# Patient Record
Sex: Female | Born: 2000 | Race: White
Health system: Southern US, Community
[De-identification: ages and names within clinical notes are randomized; demographics above are authoritative.]

## PROBLEM LIST (undated history)

## (undated) DIAGNOSIS — R51 Headache: Secondary | ICD-10-CM

## (undated) DIAGNOSIS — R519 Headache, unspecified: Secondary | ICD-10-CM

## (undated) HISTORY — DX: Headache, unspecified: R51.9

## (undated) HISTORY — DX: Headache: R51

---
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
## 2020-04-26 ENCOUNTER — Telehealth: Payer: 59 | Admitting: Family Medicine

## 2020-04-26 ENCOUNTER — Other Ambulatory Visit: Payer: Self-pay

## 2020-04-26 DIAGNOSIS — Z91199 Patient's noncompliance with other medical treatment and regimen due to unspecified reason: Secondary | ICD-10-CM

## 2020-04-26 DIAGNOSIS — Z5329 Procedure and treatment not carried out because of patient's decision for other reasons: Secondary | ICD-10-CM

## 2020-04-26 NOTE — Progress Notes (Addendum)
      Zebulin Siegel T. Denicia Pagliarulo, MD Primary Care and Sports Medicine [facility] at [facility] [address] [place], [address] Phone: [phone]  FAX: [phone]  Crystal Fuller - 20 y.o. female  MRN [id number]  Date of Birth: 02/16/2001  Visit Date: 04/26/2020  PCP: Doreene Nest, NP  Referred by: Doreene Nest, NP  Virtual Visit via Video Note:  I connected with  Crystal Fuller on 04/26/2020 12:00 PM EST by a video enabled telemedicine application and verified that I am speaking with the correct person using two identifiers.   Location patient: home computer, tablet, or smartphone Location provider: work or home office Consent: Verbal consent directly obtained from [employer]. Persons participating in the virtual visit: patient, provider  I discussed the limitations of evaluation and management by telemedicine and the availability of in person appointments. The patient expressed understanding and agreed to proceed.  Chief Complaint  Patient presents with  . Covid +    Symptoms started 1-12. Positive 1-13. Headache, cough, nasal congestion, chills, sore throat, difficulty breathing yesterday. Taking severe cold OTC. No fever. Not vaccinated.    History of Present Illness:  Would not answer phone Will not answer phone. I have been trying for 1 1/2 hours. 12:49 PM 04/26/20   Attempting call again.  Received contact from mother.    Review of Systems as above: See pertinent positives and pertinent negatives per HPI No acute distress verbally   Observations/Objective/Exam:  An attempt was made to discern vital signs over the phone and per patient if applicable and possible.   General:    Alert, Oriented, appears well and in no acute distress  Pulmonary:     On inspection no signs of respiratory distress.  Psych / Neurological:     Pleasant and cooperative.  Assessment and Plan:  No show.   I discussed the assessment and  treatment plan with the patient. The patient was provided an opportunity to ask questions and all were answered. The patient agreed with the plan and demonstrated an understanding of the instructions.   The patient was advised to call back or seek an in-person evaluation if the symptoms worsen or if the condition fails to improve as anticipated.  Follow-up: prn unless noted otherwise below No follow-ups on file.  No orders of the defined types were placed in this encounter.  No orders of the defined types were placed in this encounter.   Signed,  Elpidio Galea. Amberlyn Martinezgarcia, MD

## 2020-06-26 ENCOUNTER — Ambulatory Visit: Payer: 59 | Admitting: Primary Care

## 2020-07-22 ENCOUNTER — Encounter: Payer: Self-pay | Admitting: Primary Care

## 2020-07-22 ENCOUNTER — Other Ambulatory Visit: Payer: Self-pay

## 2020-07-22 ENCOUNTER — Ambulatory Visit (INDEPENDENT_AMBULATORY_CARE_PROVIDER_SITE_OTHER): Payer: 59 | Admitting: Primary Care

## 2020-07-22 VITALS — BP 108/60 | HR 64 | Temp 98.4°F | Ht 65.0 in | Wt 108.0 lb

## 2020-07-22 DIAGNOSIS — F411 Generalized anxiety disorder: Secondary | ICD-10-CM | POA: Insufficient documentation

## 2020-07-22 DIAGNOSIS — G43009 Migraine without aura, not intractable, without status migrainosus: Secondary | ICD-10-CM | POA: Diagnosis not present

## 2020-07-22 DIAGNOSIS — K5909 Other constipation: Secondary | ICD-10-CM

## 2020-07-22 DIAGNOSIS — R519 Headache, unspecified: Secondary | ICD-10-CM

## 2020-07-22 DIAGNOSIS — Z0000 Encounter for general adult medical examination without abnormal findings: Secondary | ICD-10-CM | POA: Diagnosis not present

## 2020-07-22 NOTE — Assessment & Plan Note (Signed)
Chronic, increased over the last year.  Discussed options and we both agreed that therapy would be a good start. Referral placed. She will update.

## 2020-07-22 NOTE — Patient Instructions (Signed)
You will be contacted regarding your referral to therapy.  Please let us know if you have not been contacted within two weeks.   Be sure to work on a healthy diet. Ensure you are consuming 64 ounces of water daily.  It was a pleasure to see you today!   Preventive Care 20-21 Years Old, Female Preventive care refers to lifestyle choices and visits with your health care provider that can promote health and wellness. At this stage in your life, you may start seeing a primary care physician instead of a pediatrician. It is important to take responsibility for your health and well-being. Preventive care for young adults includes:  A yearly physical exam. This is also called an annual wellness visit.  Regular dental and eye exams.  Immunizations.  Screening for certain conditions.  Healthy lifestyle choices, such as: ? Eating a healthy diet. ? Getting regular exercise. ? Not using drugs or products that contain nicotine and tobacco. ? Limiting alcohol use. What can I expect for my preventive care visit? Physical exam Your health care provider may check your:  Height and weight. These may be used to calculate your BMI (body mass index). BMI is a measurement that tells if you are at a healthy weight.  Heart rate and blood pressure.  Body temperature.  Skin for abnormal spots. Counseling Your health care provider may ask you questions about your:  Past medical problems.  Family's medical history.  Alcohol, tobacco, and drug use.  Home life and relationship well-being.  Access to firearms.  Emotional well-being.  Diet, exercise, and sleep habits.  Sexual activity and sexual health.  Method of birth control.  Menstrual cycle.  Pregnancy history. What immunizations do I need? Vaccines are usually given at various ages, according to a schedule. Your health care provider will recommend vaccines for you based on your age, medical history, and lifestyle or other factors,  such as travel or where you work.   What tests do I need? Blood tests  Lipid and cholesterol levels. These may be checked every 5 years starting at age 20.  Hepatitis C test.  Hepatitis B test. Screening  Pelvic exam and Pap test. This may be done every 3 years starting at age 20.  STD (sexually transmitted disease) testing, if you are at risk.  BRCA-related cancer screening. This may be done if you have a family history of breast, ovarian, tubal, or peritoneal cancers. Other tests  Tuberculosis skin test.  Vision and hearing tests.  Skin exam.  Breast exam. Talk with your health care provider about your test results, treatment options, and if necessary, the need for more tests. Follow these instructions at home: Eating and drinking  Eat a healthy diet that includes fresh fruits and vegetables, whole grains, lean protein, and low-fat dairy products.  Drink enough fluid to keep your urine pale yellow.  Do not drink alcohol if: ? Your health care provider tells you not to drink. ? You are pregnant, may be pregnant, or are planning to become pregnant. ? You are under the legal drinking age. In the [place] the legal drinking age is 21.  If you drink alcohol: ? Limit how much you use to 0-1 drink a day. ? Be aware of how much alcohol is in your drink. In the [place] one drink equals one 12 oz bottle of beer (355 mL), one 5 oz glass of wine (148 mL), or one 1 oz glass of hard liquor (44 mL).   Lifestyle    Take daily care of your teeth and gums. Brush your teeth every morning and night with fluoride toothpaste. Floss one time each day.  Stay active. Exercise for at least 30 minutes 5 or more days of the week.  Do not use any products that contain nicotine or tobacco, such as cigarettes, e-cigarettes, and chewing tobacco. If you need help quitting, ask your health care provider.  Do not use drugs.  If you are sexually active, practice safe sex. Use a condom or other form of  protection to prevent STIs (sexually transmitted infections).  If you do not wish to become pregnant, use a form of birth control. If you plan to become pregnant, see your health care provider for a prepregnancy visit.  Find healthy ways to cope with stress, such as: ? Meditation, yoga, or listening to music. ? Journaling. ? Talking to a trusted person. ? Spending time with friends and family. Safety  Always wear your seat belt while driving or riding in a vehicle.  Do not drive: ? If you have been drinking alcohol. Do not ride with someone who has been drinking. ? When you are tired or distracted. ? While texting.  Wear a helmet and other protective equipment during sports activities.  If you have firearms in your house, make sure you follow all gun safety procedures.  Seek help if you have been bullied, physically abused, or sexually abused.  Use the Internet responsibly to avoid dangers, such as online bullying and online sex predators. What's next?  Go to your health care provider once a year for an annual wellness visit.  Ask your health care provider how often you should have your eyes and teeth checked.  Stay up to date on all vaccines. This information is not intended to replace advice given to you by your health care provider. Make sure you discuss any questions you have with your health care provider. Document Revised: 11/25/2019 Document Reviewed: 03/23/2018 Elsevier Patient Education  2021 [employer].

## 2020-07-22 NOTE — Assessment & Plan Note (Signed)
Occasional migraines, over improved. No concerns today.

## 2020-07-22 NOTE — Progress Notes (Signed)
Subjective:    Patient ID: Crystal Fuller, female    DOB: 2001-02-20, 20 y.o.   MRN: [id number]  HPI  Crystal Fuller is a very pleasant 20 y.o. female who presents today for complete physical.  She does struggle with chronic anxiety, bothersome over the last year. Symptoms include worry, feeling nervous, muscles tense up, irritability. Sometimes it's hard for her to get out of bed due to lack of motivation and worrying that something may go wrong. She denies feeling down/depressed.   GAD 7 : Generalized Anxiety Score 07/22/2020  Nervous, Anxious, on Edge 2  Control/stop worrying 1  Worry too much - different things 2  Trouble relaxing 0  Restless 0  Easily annoyed or irritable 2  Afraid - awful might happen 2  Total GAD 7 Score 9  Anxiety Difficulty Somewhat difficult      Immunizations: -Tetanus: 2013 -Influenza: Did not complete this season  -Covid-19: Has not completed  -HPV: Completed series  Diet: She endorses a fair diet. Exercise: She is active   Eye exam: No recent exam Dental exam: No recent exam  BP Readings from Last 3 Encounters:  07/22/20 108/60  03/27/19 100/62  05/22/18 100/72 (15 %, Z = -1.04 /  78 %, Z = 0.77)*   *BP percentiles are based on the 2017 AAP Clinical Practice Guideline for girls      Review of Systems  Constitutional: Negative for unexpected weight change.  HENT: Negative for rhinorrhea.   Eyes: Negative for visual disturbance.  Respiratory: Negative for cough and shortness of breath.   Cardiovascular: Negative for chest pain.  Gastrointestinal: Negative for constipation and diarrhea.  Genitourinary: Negative for difficulty urinating and menstrual problem.  Musculoskeletal: Negative for arthralgias.  Skin: Negative for rash.  Allergic/Immunologic: Negative for environmental allergies.  Neurological: Negative for dizziness and headaches.  Psychiatric/Behavioral: The patient is nervous/anxious.        See HPI         Past  Medical History:  Diagnosis Date  . Constipation   . Headache     Social History   Socioeconomic History  . Marital status: Single    Spouse name: Not on file  . Number of children: Not on file  . Years of education: Not on file  . Highest education level: Not on file  Occupational History  . Not on file  Tobacco Use  . Smoking status: Passive Smoke Exposure - Never Smoker  . Smokeless tobacco: Never Used  Substance and Sexual Activity  . Alcohol use: No  . Drug use: No  . Sexual activity: Never    Birth control/protection: Abstinence  Other Topics Concern  . Not on file  Social History Narrative   Completed 3rd grade   Social Determinants of Health   Financial Resource Strain: Not on file  Food Insecurity: Not on file  Transportation Needs: Not on file  Physical Activity: Not on file  Stress: Not on file  Social Connections: Not on file  Intimate Partner Violence: Not on file    History reviewed. No pertinent surgical history.  Family History  Problem Relation Age of Onset  . Hirschsprung's disease Neg Hx   . Migraines Sister   . Migraines Mother   . Migraines Paternal Uncle   . Stroke Paternal Grandfather   . Heart Problems Paternal Grandfather   . Seizures Paternal Grandfather     No Known Allergies  No current outpatient medications on file prior to visit.  No current facility-administered medications on file prior to visit.    BP 108/60   Pulse 64   Temp 98.4 F (36.9 C) (Temporal)   Ht 5\' 5"  (1.651 m)   Wt 108 lb (49 kg)   BMI 17.97 kg/m  Objective:   Physical Exam HENT:     Right Ear: Tympanic membrane and ear canal normal.     Left Ear: Tympanic membrane and ear canal normal.     Nose: Nose normal.  Eyes:     Conjunctiva/sclera: Conjunctivae normal.     Pupils: Pupils are equal, round, and reactive to light.  Neck:     Thyroid: No thyromegaly.  Cardiovascular:     Rate and Rhythm: Normal rate and regular rhythm.     Heart  sounds: No murmur heard.   Pulmonary:     Effort: Pulmonary effort is normal.     Breath sounds: Normal breath sounds. No rales.  Abdominal:     General: Bowel sounds are normal.     Palpations: Abdomen is soft.     Tenderness: There is no abdominal tenderness.  Musculoskeletal:        General: Normal range of motion.     Cervical back: Neck supple.  Lymphadenopathy:     Cervical: No cervical adenopathy.  Skin:    General: Skin is warm and dry.     Findings: No rash.  Neurological:     Mental Status: She is alert and oriented to person, place, and time.     Cranial Nerves: No cranial nerve deficit.     Deep Tendon Reflexes: Reflexes are normal and symmetric.  Psychiatric:        Mood and Affect: Mood normal.           Assessment & Plan:      This visit occurred during the SARS-CoV-2 public health emergency.  Safety protocols were in place, including screening questions prior to the visit, additional usage of staff PPE, and extensive cleaning of exam room while observing appropriate contact time as indicated for disinfecting solutions.

## 2020-07-22 NOTE — Assessment & Plan Note (Signed)
Denies concerns today.  

## 2020-07-22 NOTE — Assessment & Plan Note (Signed)
Tetanus and HPV vaccines UTD. Pap smear due at 21.  Recommended a healthy diet, regular activity. Exam today stable. No labs required.

## 2020-09-05 ENCOUNTER — Telehealth: Payer: Self-pay | Admitting: Primary Care

## 2020-09-05 NOTE — Telephone Encounter (Signed)
Pt came in office to drop off staff health assessment form. Pt said she need form back by her next appt. Put form in doctor folder

## 2020-09-09 NOTE — Telephone Encounter (Signed)
Called spoke to aunt on dpr. Pt will pick up at appointment tomorrow. I have placed note in app to let know as well.

## 2020-09-10 ENCOUNTER — Other Ambulatory Visit: Payer: Self-pay

## 2020-09-10 NOTE — Progress Notes (Signed)
Pt was here to have PPD placed for school. After review of pt's answers on form, pt did not need PPD placed. Form given to Joellen to have Jae Dire sign. Pt aware that Jae Dire is out of office until June 7th.

## 2020-09-19 ENCOUNTER — Encounter: Payer: Self-pay | Admitting: Primary Care

## 2020-09-19 ENCOUNTER — Other Ambulatory Visit: Payer: Self-pay

## 2020-09-19 ENCOUNTER — Ambulatory Visit (INDEPENDENT_AMBULATORY_CARE_PROVIDER_SITE_OTHER): Payer: 59 | Admitting: Primary Care

## 2020-09-19 DIAGNOSIS — Z8249 Family history of ischemic heart disease and other diseases of the circulatory system: Secondary | ICD-10-CM | POA: Diagnosis not present

## 2020-09-19 NOTE — Patient Instructions (Signed)
It was a pleasure to see you today!   

## 2020-09-19 NOTE — Assessment & Plan Note (Signed)
Diagnosed in patient's mother. Patient asymptomatic. Exam unremarkable.  Will look into imaging including CTA/MRA/carotid artery ultrasound and get back with patient and her family.

## 2020-09-19 NOTE — Progress Notes (Signed)
Subjective:    Patient ID: Crystal Fuller, female    DOB: 01-22-2001, 20 y.o.   MRN: [id number]  HPI  Crystal Fuller is a very pleasant 20 y.o. female with a history of migraines who presents today to discuss fibromuscular dysplasia.   She has a family history of fibromuscular dysplasia that was diagnosed in her mother several years ago. Initially noted to have carotid artery dissection, now has vertebral artery aneurysm and renal artery dissection.   Her mother's doctor recommended that her children undergo CTA's/imaging for evaluation as this could be hereditary. Her mother has had several TIA's and has been noted to have a patent foramen ovale.   The patient today denies chest pain, dizziness, weakness, headaches.   BP Readings from Last 3 Encounters:  09/19/20 98/62  07/22/20 108/60  03/27/19 100/62      Review of Systems  Eyes:  Negative for visual disturbance.  Respiratory:  Negative for shortness of breath.   Cardiovascular:  Negative for chest pain.  Neurological:  Negative for dizziness, numbness and headaches.        Past Medical History:  Diagnosis Date   Constipation    Headache     Social History   Socioeconomic History   Marital status: Single    Spouse name: Not on file   Number of children: Not on file   Years of education: Not on file   Highest education level: Not on file  Occupational History   Not on file  Tobacco Use   Smoking status: Passive Smoke Exposure - Never Smoker   Smokeless tobacco: Never  Substance and Sexual Activity   Alcohol use: No   Drug use: No   Sexual activity: Never    Birth control/protection: Abstinence  Other Topics Concern   Not on file  Social History Narrative   Completed 3rd grade   Social Determinants of Health   Financial Resource Strain: Not on file  Food Insecurity: Not on file  Transportation Needs: Not on file  Physical Activity: Not on file  Stress: Not on file  Social Connections: Not on file   Intimate Partner Violence: Not on file    No past surgical history on file.  Family History  Problem Relation Age of Onset   Hirschsprung's disease Neg Hx    Migraines Sister    Migraines Mother    Migraines Paternal Uncle    Stroke Paternal Grandfather    Heart Problems Paternal Grandfather    Seizures Paternal Grandfather     No Known Allergies  No current outpatient medications on file prior to visit.   No current facility-administered medications on file prior to visit.    BP 98/62   Pulse 67   Temp 98.4 F (36.9 C) (Temporal)   Ht 5' 4.75" (1.645 m)   Wt 105 lb 12 oz (48 kg)   SpO2 100%   BMI 17.73 kg/m  Objective:   Physical Exam Neck:     Vascular: No carotid bruit.  Cardiovascular:     Rate and Rhythm: Normal rate and regular rhythm.  Pulmonary:     Effort: Pulmonary effort is normal.     Breath sounds: Normal breath sounds.  Musculoskeletal:     Cervical back: Neck supple.  Skin:    General: Skin is warm and dry.          Assessment & Plan:      This visit occurred during the SARS-CoV-2 public health emergency.  Safety protocols  were in place, including screening questions prior to the visit, additional usage of staff PPE, and extensive cleaning of exam room while observing appropriate contact time as indicated for disinfecting solutions.

## 2020-09-23 ENCOUNTER — Telehealth: Payer: Self-pay | Admitting: Primary Care

## 2020-09-23 NOTE — Telephone Encounter (Signed)
   Please notify patient and family that Dr. Para March and I spoke regarding the fibromuscular screening and we found no specific guidelines for screening for fibromuscular dysplasia in young healthy patients.    Since she doesn't have elevated BP or carotid bruit, I would defer imaging at this point.  Should she have a bruit or elevated BP, sudden onset of severe headaches then would potentially recommend imaging at that time.    It may be reasonable to screen patient for fibromuscular dysplasia 10 years of family history, meaning potentially getting a carotid ultrasound around age 49.

## 2020-10-20 ENCOUNTER — Telehealth: Payer: Self-pay

## 2020-10-20 NOTE — Telephone Encounter (Signed)
Colin Mulders, patient's aunt on DPR on file, called stating patient has had a dry cough for 2 weeks now. She is around smoker but no COVID exposure that we know of. No other symptoms at all. Scheduled for in person unless need to change to virtual please let Rose, aunt, know. Thank you.

## 2020-10-20 NOTE — Telephone Encounter (Signed)
Patient has had cough for over 2 weeks and not other symptoms. Ok to see in office?

## 2020-10-21 ENCOUNTER — Other Ambulatory Visit: Payer: Self-pay

## 2020-10-21 ENCOUNTER — Ambulatory Visit (INDEPENDENT_AMBULATORY_CARE_PROVIDER_SITE_OTHER): Payer: 59 | Admitting: Primary Care

## 2020-10-21 ENCOUNTER — Encounter: Payer: Self-pay | Admitting: Primary Care

## 2020-10-21 VITALS — BP 98/60 | HR 85 | Temp 98.5°F | Ht 64.75 in | Wt 106.0 lb

## 2020-10-21 DIAGNOSIS — R059 Cough, unspecified: Secondary | ICD-10-CM | POA: Insufficient documentation

## 2020-10-21 NOTE — Progress Notes (Signed)
Subjective:    Patient ID: Crystal Fuller, female    DOB: 2000-11-13, 20 y.o.   MRN: [id number]  HPI  Crystal Fuller is a very pleasant 20 y.o. female who presents today to discuss cough.   Her cough began about 2-3 weeks ago, occurs consistently throughout the day, no worse at night. Cough is dry, congested sometime, also with some post nasal drip, some nasal congestion.   She denies shortness of breath, fevers, chills, esophageal burning, sneezing, itchy/watery eyes, childhood asthma.   She's not take anything OTC for symptoms. She feels well overall. Overall her cough is about the same. She's not been tested for Covid-19. She's not had Covid-19 vaccines. She did have Covid infection in January 2022.   Review of Systems  Constitutional:  Negative for chills and fever.  HENT:  Positive for congestion and postnasal drip.   Respiratory:  Positive for cough.   Musculoskeletal:  Negative for myalgias.        Past Medical History:  Diagnosis Date   Constipation    Headache     Social History   Socioeconomic History   Marital status: Single    Spouse name: Not on file   Number of children: Not on file   Years of education: Not on file   Highest education level: Not on file  Occupational History   Not on file  Tobacco Use   Smoking status: Passive Smoke Exposure - Never Smoker   Smokeless tobacco: Never  Substance and Sexual Activity   Alcohol use: No   Drug use: No   Sexual activity: Never    Birth control/protection: Abstinence  Other Topics Concern   Not on file  Social History Narrative   Completed 3rd grade   Social Determinants of Health   Financial Resource Strain: Not on file  Food Insecurity: Not on file  Transportation Needs: Not on file  Physical Activity: Not on file  Stress: Not on file  Social Connections: Not on file  Intimate Partner Violence: Not on file    History reviewed. No pertinent surgical history.  Family History  Problem Relation  Age of Onset   Migraines Mother    Other Mother        Fibromuscular dysplasia, PFO, carotid artery dissection, vertebral artery aneurysm   Deep vein thrombosis Mother    Migraines Sister    Stroke Paternal Grandfather    Heart Problems Paternal Grandfather    Seizures Paternal Grandfather    Migraines Paternal Uncle    Hirschsprung's disease Neg Hx     No Known Allergies  No current outpatient medications on file prior to visit.   No current facility-administered medications on file prior to visit.    BP 98/60   Pulse 85   Temp 98.5 F (36.9 C) (Temporal)   Ht 5' 4.75" (1.645 m)   Wt 106 lb (48.1 kg)   SpO2 98%   BMI 17.78 kg/m  Objective:   Physical Exam Constitutional:      Appearance: She is not ill-appearing.  Cardiovascular:     Rate and Rhythm: Normal rate and regular rhythm.  Pulmonary:     Effort: Pulmonary effort is normal.     Breath sounds: Normal breath sounds. No wheezing or rhonchi.  Musculoskeletal:     Cervical back: Neck supple.  Skin:    General: Skin is warm and dry.          Assessment & Plan:      This  visit occurred during the SARS-CoV-2 public health emergency.  Safety protocols were in place, including screening questions prior to the visit, additional usage of staff PPE, and extensive cleaning of exam room while observing appropriate contact time as indicated for disinfecting solutions.

## 2020-10-21 NOTE — Assessment & Plan Note (Signed)
Acute for 2-3 weeks. No known history of asthma.   Doesn't appear acutely ill.  Lungs clear on exam.   Checking chest xray.  Will swab for Covid-19 per PCR to see if she may be recovering from lingering symptoms.  Could be allergy cause, will have her start a daily antihistamine. Consider adding famotidine if no improvement.   She will update.

## 2020-10-21 NOTE — Patient Instructions (Signed)
Start taking cetirizine (Zyrtec) 10 mg at bedtime for the cough. Take this every night.   Go complete your chest xray at [facility] [address] [place].   Please call me with an update in one week.   It was a pleasure to see you today!

## 2020-10-21 NOTE — Telephone Encounter (Signed)
Noted, will evaluate. 

## 2020-10-22 LAB — NOVEL CORONAVIRUS, NAA: SARS-CoV-2, NAA: NOT DETECTED

## 2020-10-22 LAB — SPECIMEN STATUS REPORT

## 2020-10-22 LAB — SARS-COV-2, NAA 2 DAY TAT

## 2020-10-23 ENCOUNTER — Ambulatory Visit
Admission: RE | Admit: 2020-10-23 | Discharge: 2020-10-23 | Disposition: A | Discharge status: Home / Self Care | Payer: BC Managed Care – PPO | Source: Ambulatory Visit | Attending: Primary Care | Admitting: Primary Care

## 2020-10-23 DIAGNOSIS — R059 Cough, unspecified: Secondary | ICD-10-CM

## 2020-11-26 ENCOUNTER — Ambulatory Visit (INDEPENDENT_AMBULATORY_CARE_PROVIDER_SITE_OTHER): Payer: 59 | Admitting: Nurse Practitioner

## 2020-11-26 ENCOUNTER — Encounter: Payer: Self-pay | Admitting: Nurse Practitioner

## 2020-11-26 ENCOUNTER — Other Ambulatory Visit: Payer: Self-pay

## 2020-11-26 VITALS — BP 110/72 | HR 90 | Temp 98.8°F | Resp 10 | Ht 65.0 in | Wt 105.5 lb

## 2020-11-26 DIAGNOSIS — R519 Headache, unspecified: Secondary | ICD-10-CM | POA: Diagnosis not present

## 2020-11-26 DIAGNOSIS — R42 Dizziness and giddiness: Secondary | ICD-10-CM

## 2020-11-26 NOTE — Assessment & Plan Note (Signed)
Did have blood pressure changes when checked orthostatically.  Not technically orthostatic patient states she drinks about 3x16 ounce glasses of water a day.  States she has not eaten today.  Did encourage good hydration looking for pale yellow in the urine.  And also eating.  We will continue to monitor follow-up if symptoms fail to improve or worsen.

## 2020-11-26 NOTE — Patient Instructions (Signed)
Drink plenty of fluids and eat. These are common causes of headaches If it does not get better in the next few days let use know. Follow up as needed or as scheduled

## 2020-11-26 NOTE — Progress Notes (Signed)
Acute Office Visit  Subjective:    Patient ID: Crystal Fuller, female    DOB: [birth year], 20 y.o.   MRN: [id number]  Chief Complaint  Patient presents with   Headache    Since 11/21/20, every day. Describes the pain as pounding in the forehead area. Some dizziness present with standing up and lasts a few seconds to 1 to 2 minutes. Has tried Ibuprofen and Midol.      Patient is in today for headache that started on Friday towards the end of the day. Worse with up and moving States that it gets some better with laying down. Ibuprofen and Midol. Ibuprofen 800 mg last night. At home test for covid was negative 2 days ago. Described as pounding. States she drinks plenty of water.  History of migraines states they where when she was in middle school. She is unsure if this is the same type of headache as she has had back then   Past Medical History:  Diagnosis Date   Constipation    Headache     No past surgical history on file.  Family History  Problem Relation Age of Onset   Migraines Mother    Other Mother        Fibromuscular dysplasia, PFO, carotid artery dissection, vertebral artery aneurysm   Deep vein thrombosis Mother    Migraines Sister    Stroke Paternal Grandfather    Heart Problems Paternal Grandfather    Seizures Paternal Grandfather    Migraines Paternal Uncle    Hirschsprung's disease Neg Hx     Social History   Socioeconomic History   Marital status: Single    Spouse name: Not on file   Number of children: Not on file   Years of education: Not on file   Highest education level: Not on file  Occupational History   Not on file  Tobacco Use   Smoking status: Passive Smoke Exposure - Never Smoker   Smokeless tobacco: Never  Substance and Sexual Activity   Alcohol use: No   Drug use: No   Sexual activity: Never    Birth control/protection: Abstinence  Other Topics Concern   Not on file  Social History Narrative   Completed 3rd grade   Social  Determinants of Health   Financial Resource Strain: Not on file  Food Insecurity: Not on file  Transportation Needs: Not on file  Physical Activity: Not on file  Stress: Not on file  Social Connections: Not on file  Intimate Partner Violence: Not on file    No outpatient medications prior to visit.   No facility-administered medications prior to visit.    No Known Allergies  Review of Systems  Constitutional:  Negative for chills and fever.  HENT:  Negative for ear pain and sore throat.   Eyes:  Negative for photophobia and visual disturbance.  Respiratory:  Positive for cough. Negative for shortness of breath.   Cardiovascular:  Negative for chest pain, palpitations and leg swelling.  Gastrointestinal:  Negative for diarrhea, nausea and vomiting.  Skin:  Negative for color change and pallor.  Neurological:  Positive for dizziness and headaches. Negative for speech difficulty, weakness and numbness.      Objective:    Physical Exam Vitals and nursing note reviewed.  Constitutional:      Appearance: Normal appearance.  HENT:     Right Ear: Tympanic membrane, ear canal and external ear normal. There is no impacted cerumen.     Left Ear: Tympanic  membrane, ear canal and external ear normal. There is no impacted cerumen.     Mouth/Throat:     Mouth: Mucous membranes are moist.     Pharynx: Oropharynx is clear.  Eyes:     Extraocular Movements: Extraocular movements intact.     Pupils: Pupils are equal, round, and reactive to light.  Cardiovascular:     Rate and Rhythm: Normal rate and regular rhythm.  Pulmonary:     Effort: Pulmonary effort is normal.     Breath sounds: Normal breath sounds.  Abdominal:     General: Bowel sounds are normal.  Musculoskeletal:     Cervical back: Normal range of motion. No muscular tenderness.  Lymphadenopathy:     Cervical: No cervical adenopathy.  Skin:    General: Skin is warm.  Neurological:     Mental Status: She is alert.      Cranial Nerves: No cranial nerve deficit.     Sensory: No sensory deficit.     Motor: No weakness.     Gait: Gait normal.     Deep Tendon Reflexes: Reflexes normal.  Psychiatric:        Mood and Affect: Mood normal.        Behavior: Behavior normal.        Thought Content: Thought content normal.        Judgment: Judgment normal.    BP 110/72   Pulse 90   Temp 98.8 F (37.1 C)   Resp 10   Ht $R'5\' 5"'Sw$  (1.651 m)   Wt 105 lb 8 oz (47.9 kg)   LMP 11/23/2020   SpO2 98%   BMI 17.56 kg/m  Wt Readings from Last 3 Encounters:  11/26/20 105 lb 8 oz (47.9 kg) (9 %, Z= -1.37)*  10/21/20 106 lb (48.1 kg) (9 %, Z= -1.33)*  09/19/20 105 lb 12 oz (48 kg) (9 %, Z= -1.34)*   * Growth percentiles are based on CDC (Girls, 2-20 Years) data.    Health Maintenance Due  Topic Date Due   HIV Screening  Never done   Hepatitis C Screening  Never done   INFLUENZA VACCINE  11/10/2020    There are no preventive care reminders to display for this patient.   No results found for: TSH No results found for: WBC, HGB, HCT, MCV, PLT No results found for: NA, K, CHLORIDE, CO2, GLUCOSE, BUN, CREATININE, BILITOT, ALKPHOS, AST, ALT, PROT, ALBUMIN, CALCIUM, ANIONGAP, EGFR, GFR No results found for: CHOL No results found for: HDL No results found for: LDLCALC No results found for: TRIG No results found for: CHOLHDL No results found for: HGBA1C     Assessment & Plan:   Problem List Items Addressed This Visit       Other   Acute intractable headache - Primary    Patient recently got off of her cycle.  States Friday afternoon she started with a headache and has kept headache since then.  Has been trying ibuprofen and Midol without relief.  Patient endorses that she drinks plenty of fluid.  Has not had anything to eat today though.  Did offer patient an injection of Toradol in office saying that I could break the headache cycle she declined.  Did inform her she can take ibuprofen 800 mg 3 times daily with  food to see if that will make the headache go away and into encouraged her to increase her fluid intake as she did have some modest blood pressure changes from lying to standing.  Do not feel like this is tension type headache as it is not viselike feeling or a band around the head      Dizziness    Did have blood pressure changes when checked orthostatically.  Not technically orthostatic patient states she drinks about 3x16 ounce glasses of water a day.  States she has not eaten today.  Did encourage good hydration looking for pale yellow in the urine.  And also eating.  We will continue to monitor follow-up if symptoms fail to improve or worsen.      Relevant Orders   Orthostatic vital signs     No orders of the defined types were placed in this encounter.    Romilda Garret, NP  This visit occurred during the SARS-CoV-2 public health emergency.  Safety protocols were in place, including screening questions prior to the visit, additional usage of staff PPE, and extensive cleaning of exam room while observing appropriate contact time as indicated for disinfecting solutions.

## 2020-11-26 NOTE — Assessment & Plan Note (Signed)
Patient recently got off of her cycle.  States Friday afternoon she started with a headache and has kept headache since then.  Has been trying ibuprofen and Midol without relief.  Patient endorses that she drinks plenty of fluid.  Has not had anything to eat today though.  Did offer patient an injection of Toradol in office saying that I could break the headache cycle she declined.  Did inform her she can take ibuprofen 800 mg 3 times daily with food to see if that will make the headache go away and into encouraged her to increase her fluid intake as she did have some modest blood pressure changes from lying to standing.  Do not feel like this is tension type headache as it is not viselike feeling or a band around the head

## 2020-12-08 ENCOUNTER — Telehealth: Payer: Self-pay

## 2020-12-08 NOTE — Telephone Encounter (Signed)
Received fax from [employer]. Needs signature on PPD screening form as well as results. Form states that if screening was negative that no ppd was needed. I have called school and verified that no test needed. They confirmed just needs to have documented on form and sent back to them. The area that needs to have her signature is ok to fax to them and they will have her do at school.   Placed in your box for review.

## 2020-12-09 NOTE — Telephone Encounter (Signed)
Ppw faxed  

## 2020-12-09 NOTE — Telephone Encounter (Signed)
Completed and placed in Joellen's inbox. 

## 2021-06-03 ENCOUNTER — Encounter: Payer: Self-pay | Admitting: Primary Care

## 2021-06-03 ENCOUNTER — Other Ambulatory Visit: Payer: Self-pay

## 2021-06-03 ENCOUNTER — Ambulatory Visit (INDEPENDENT_AMBULATORY_CARE_PROVIDER_SITE_OTHER): Payer: 59 | Admitting: Primary Care

## 2021-06-03 VITALS — BP 110/88 | HR 110 | Temp 98.6°F | Ht 65.0 in | Wt 102.0 lb

## 2021-06-03 DIAGNOSIS — R1115 Cyclical vomiting syndrome unrelated to migraine: Secondary | ICD-10-CM | POA: Insufficient documentation

## 2021-06-03 LAB — POCT URINE PREGNANCY: Preg Test, Ur: NEGATIVE

## 2021-06-03 MED ORDER — NORETHINDRONE ACET-ETHINYL EST 1-20 MG-MCG PO TABS: 1.0000 | ORAL_TABLET | Freq: Every day | ORAL | 1 refills | Status: DC

## 2021-06-03 NOTE — Patient Instructions (Signed)
Take your first birth control pill this Sunday. You must take your pill at the same time each day.  If you miss a pill then take the pill as soon as you remember, and also take your pill at the regularly scheduled time, even if this means taking two pills in one day. If you miss more than two pills consecutively, then please call me.  It may take a few months to improve your symptoms.  Please do notify me if your symptoms do not improve after 3 months and/or your symptoms occur outside of your cycle, get worse.  It was a pleasure to see you today!

## 2021-06-03 NOTE — Assessment & Plan Note (Addendum)
With menses.  Exam today benign. She appears stable.  Spent 10 minutes reviewing recommendations on Up To Date for treatment. Treatment for cyclical nausea and vomiting surrounding menses can include OCP's. Low estrogen is recommended. Discussed this today with patient.  Urine pregnancy test negative.   Rx for Microgestin 1/20 mg-mcg sent to pharmacy. We discussed to notify me if she develops mood alterations. Discussed that it may take a few months to determine effectiveness.   Discussed start date of this coming Sunday. Discussed other instructions.   Follow up as needed. Consider GI vs GYN if no improvement and/or if symptoms occur outside of menses.

## 2021-06-03 NOTE — Addendum Note (Signed)
Addended by: Donnamarie Poag on: 06/03/2021 08:05 AM   Modules accepted: Orders

## 2021-06-03 NOTE — Progress Notes (Signed)
Subjective:    Patient ID: Crystal Fuller, female    DOB: 10/25/2000, 21 y.o.   MRN: [id number]  HPI  Crystal Fuller is a very pleasant 21 y.o. female with a history of chronic constipation, frequent headaches, generalized anxiety, dizziness who presents today to discuss nausea and vomiting.  Chronic for the last 1-2 years, occurring intermittently surrounding menstrual cycle, historically she has 1 episode of nausea and vomiting per menstrual cycle. Recent menstrual cycle (menses) began 05/29/21, yesterday she vomited multiple times for three different episodes, had to leave work. She'd never had an occurrence like this before.   Today she feels slightly nauseated and lightheaded. Has not eaten or had much to drink. No vomiting today.   She denies constipation, diarrhea, bloody stools, fevers, abdominal pain. Her typical menses lasts 3-4 days, light overall, uses pads which never get fully saturated. Her menses concluded yesterday.   Outside of her menses she has no symptoms of nausea and vomiting.  She was once managed on birth control, Junel and Sprintec, thinks Junel made her moody. She doesn't recall any problems with Sprintec.   She is also needing a work note for excuse.    Review of Systems  Constitutional:  Negative for fever.  Gastrointestinal:  Positive for nausea and vomiting. Negative for abdominal pain, blood in stool, constipation and diarrhea.  Neurological:  Positive for light-headedness.        Past Medical History:  Diagnosis Date   Constipation    Headache     Social History   Socioeconomic History   Marital status: Single    Spouse name: Not on file   Number of children: Not on file   Years of education: Not on file   Highest education level: Not on file  Occupational History   Not on file  Tobacco Use   Smoking status: Never    Passive exposure: Yes   Smokeless tobacco: Never  Substance and Sexual Activity   Alcohol use: No   Drug use: No    Sexual activity: Never    Birth control/protection: Abstinence  Other Topics Concern   Not on file  Social History Narrative   Completed 3rd grade   Social Determinants of Health   Financial Resource Strain: Not on file  Food Insecurity: Not on file  Transportation Needs: Not on file  Physical Activity: Not on file  Stress: Not on file  Social Connections: Not on file  Intimate Partner Violence: Not on file    History reviewed. No pertinent surgical history.  Family History  Problem Relation Age of Onset   Migraines Mother    Other Mother        Fibromuscular dysplasia, PFO, carotid artery dissection, vertebral artery aneurysm   Deep vein thrombosis Mother    Migraines Sister    Stroke Paternal Grandfather    Heart Problems Paternal Grandfather    Seizures Paternal Grandfather    Migraines Paternal Uncle    Hirschsprung's disease Neg Hx     No Known Allergies  No current outpatient medications on file prior to visit.   No current facility-administered medications on file prior to visit.    BP 110/88    Pulse (!) 110    Temp 98.6 F (37 C) (Temporal)    Ht 5\' 5"  (1.651 m)    Wt 102 lb (46.3 kg)    LMP 05/29/2021    SpO2 99%    BMI 16.97 kg/m  Objective:   Physical Exam  Constitutional:      General: She is not in acute distress.    Appearance: She is not ill-appearing.  Cardiovascular:     Rate and Rhythm: Normal rate.  Pulmonary:     Effort: Pulmonary effort is normal.  Abdominal:     General: Bowel sounds are normal.     Palpations: Abdomen is soft.     Tenderness: There is no abdominal tenderness.  Musculoskeletal:     Cervical back: Neck supple.  Skin:    General: Skin is warm and dry.            This visit occurred during the SARS-CoV-2 public health emergency.  Safety protocols were in place, including screening questions prior to the visit, additional usage of staff PPE, and extensive cleaning of exam room while observing appropriate contact  time as indicated for disinfecting solutions.

## 2021-08-20 ENCOUNTER — Encounter: Payer: 59 | Admitting: Primary Care

## 2022-01-02 IMAGING — CR DG CHEST 2V
2 series · 2 of 2 positions shown · non-contrast
Comparison: None.

CLINICAL DATA: Cough for 2-3 weeks.

EXAM:
CHEST - 2 VIEW

[w chest pa]
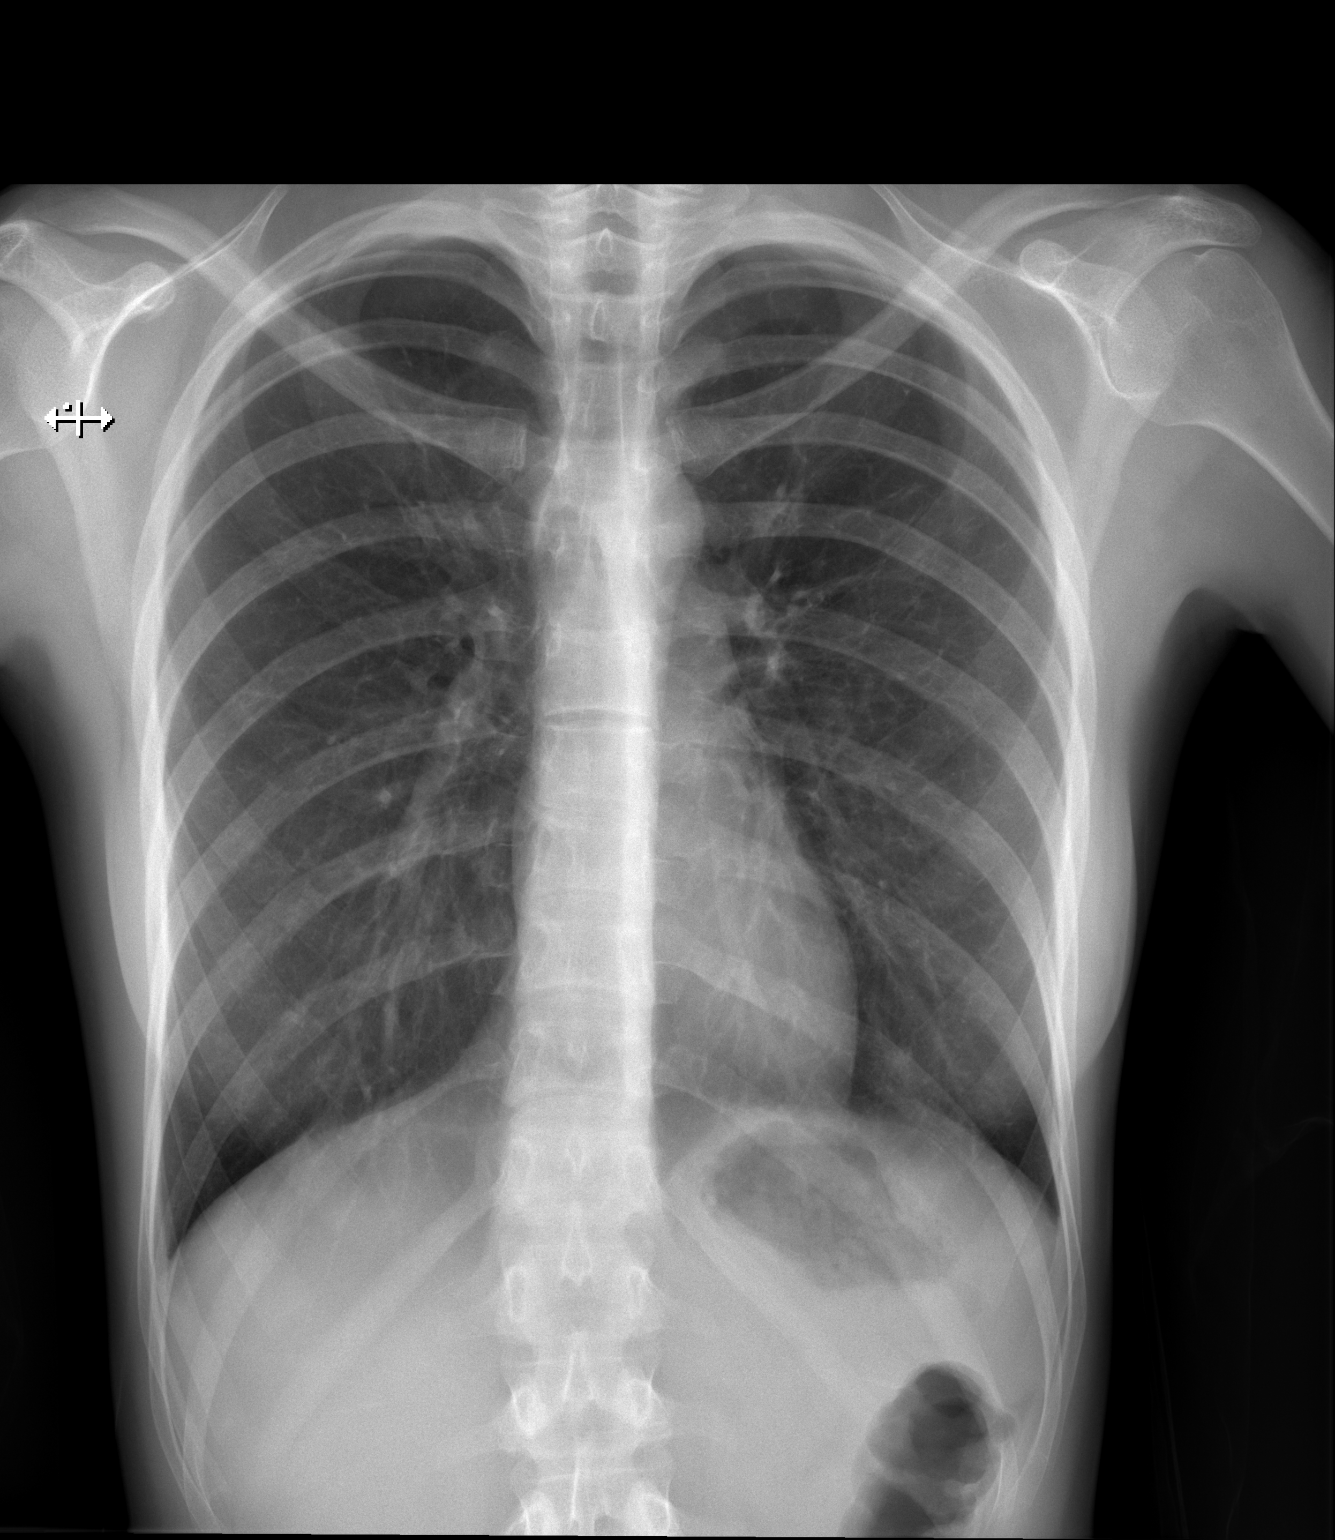

[w chest lat]
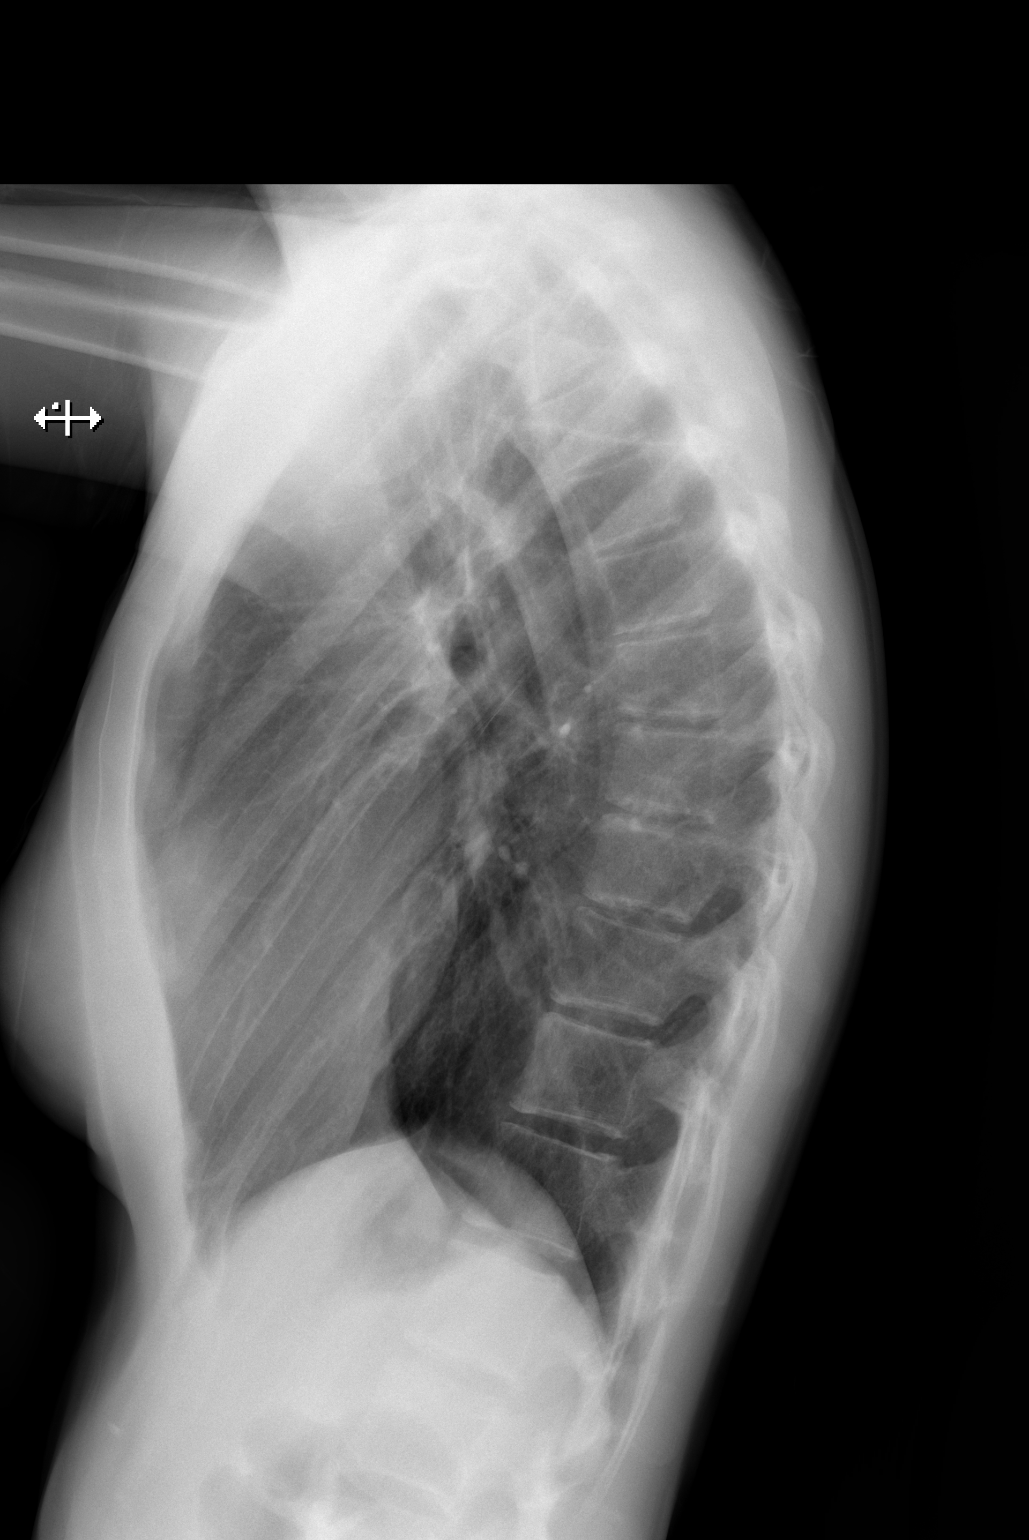

[2 of 2 positions shown; findings below may reference images not displayed]

FINDINGS: The heart size and mediastinal contours are within normal limits.
Both lungs are clear. The visualized skeletal structures are
unremarkable.
IMPRESSION: Normal exam.

## 2022-02-05 ENCOUNTER — Telehealth: Payer: Self-pay

## 2022-02-05 DIAGNOSIS — Z87898 Personal history of other specified conditions: Secondary | ICD-10-CM

## 2022-02-05 MED ORDER — SCOPOLAMINE 1 MG/3DAYS TD PT72: MEDICATED_PATCH | TRANSDERMAL | 0 refills | Status: DC

## 2022-02-05 NOTE — Telephone Encounter (Signed)
Noted. Rx sent to pharmacy.  Have her apply 1 patch behind her ear 4 hours prior to her cruise, and change every three days.   Also, overdue for CPE. Please schedule.

## 2022-02-05 NOTE — Telephone Encounter (Signed)
Called pt but vm is full unable to leave a message.  

## 2022-02-05 NOTE — Telephone Encounter (Signed)
Pt going on cruise 03/10/22 - 03/17/22. Requesting patches to prevent sea sickness.[facility] [place]

## 2022-02-08 NOTE — Telephone Encounter (Signed)
Unable to reach patient. Left voicemail to return call to our office.   

## 2022-02-09 NOTE — Telephone Encounter (Signed)
Rose (DPR signed) notified as instructed and voiced understanding; Kalman Shan will let pt know to cb and schedule annual exam.

## 2022-05-19 ENCOUNTER — Ambulatory Visit (INDEPENDENT_AMBULATORY_CARE_PROVIDER_SITE_OTHER): Payer: 59 | Admitting: Family Medicine

## 2022-05-19 ENCOUNTER — Encounter: Payer: Self-pay | Admitting: Family Medicine

## 2022-05-19 VITALS — BP 114/62 | HR 86 | Temp 97.9°F | Ht 65.0 in | Wt 101.5 lb

## 2022-05-19 DIAGNOSIS — G43009 Migraine without aura, not intractable, without status migrainosus: Secondary | ICD-10-CM | POA: Diagnosis not present

## 2022-05-19 DIAGNOSIS — F40298 Other specified phobia: Secondary | ICD-10-CM | POA: Diagnosis not present

## 2022-05-19 MED ORDER — SUMATRIPTAN SUCCINATE 50 MG PO TABS: ORAL_TABLET | ORAL | 0 refills | Status: AC

## 2022-05-19 MED ORDER — PROMETHAZINE HCL 25 MG PO TABS: 25.0000 mg | ORAL_TABLET | Freq: Three times a day (TID) | ORAL | 0 refills | Status: AC | PRN
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
## 2022-05-19 NOTE — Patient Instructions (Addendum)
Take ibuprofen at needed with food   Try the phenergan for nausea and headache  It will make you sleepy -use caution   Try imitrex one pill and update [place] with how it goes   Drink lots of fluids  Some caffeine is ok during a headache but avoid it daily   Follow up with Anda Kraft in 1-2 weeks to discuss long term headache plan

## 2022-05-19 NOTE — Assessment & Plan Note (Signed)
Severe fear of needles and blood draws  This may cause problems in the future Enc pt to d/w her pcp

## 2022-05-19 NOTE — Assessment & Plan Note (Addendum)
Long h/o headaches that are likely migraine /features of , also fam hx Reviewed the chart today  Unsure what trigger are /? Consider HA diary Now 2-3 times per wk  Did not tol OC even though it helped headaches  Reassuring exam  Offered toradol shot today to break cycle but pt declined (needle phobia)  Decided on trial of imitrex (50 mg to start)-disc poss side eff Nsaid/ibuprofen 800 (but do not over use) Phenergan for nausea with caution of sedation  Enc caffeine for headache but not regular caffeine  Enc good fluids and regular sleep/wake times  Suspect she needs proph tx (was on amitriptyline) but will leave that up to pcp- made appt for f/u  Inst to update if this HA does not improve Update if not starting to improve in a week or if worsening  ER precautions reviewed  31  Minutes were spent today both face to face and in the chart obtaining history, reviewing records, performing exam , educating and discussing treatment options, and placing orders

## 2023-04-12 ENCOUNTER — Encounter: Payer: Self-pay | Admitting: Family Medicine

## 2023-04-21 ENCOUNTER — Ambulatory Visit: Payer: 59 | Admitting: Primary Care

## 2023-11-01 ENCOUNTER — Ambulatory Visit (INDEPENDENT_AMBULATORY_CARE_PROVIDER_SITE_OTHER): Admitting: Podiatry

## 2023-11-01 ENCOUNTER — Encounter: Payer: Self-pay | Admitting: Podiatry

## 2023-11-01 ENCOUNTER — Other Ambulatory Visit: Payer: Self-pay | Admitting: Podiatry

## 2023-11-01 DIAGNOSIS — L602 Onychogryphosis: Secondary | ICD-10-CM

## 2023-11-01 NOTE — Addendum Note (Signed)
 Addended by: NICHOLAUS MARJORIE SAILOR on: 11/01/2023 04:53 PM   Modules accepted: Orders

## 2023-11-01 NOTE — Progress Notes (Signed)
   Chief Complaint  Patient presents with   Nail Problem    Pt is here due to bilateral great toenails and right 2nd toenail, all the toenail are thick and discolored, but the left on is curving to the side and is painful, state she would like the have the left toenail taken off, possibly the others as well.    HPI: 23 y.o. female presenting today as a reestablished new patient for evaluation of thickening and layering of the bilateral great toes as well as the right second digit.  Onset for several years.  Past Medical History:  Diagnosis Date   Constipation    Headache     No past surgical history on file.  No Known Allergies   Physical Exam: General: The patient is alert and oriented x3 in no acute distress.  Dermatology: Layered hyperkeratotic dystrophic nails noted to the bilateral great toenails as well as the right second nail plate  Vascular: Palpable pedal pulses bilaterally. Capillary refill within normal limits.  No appreciable edema.  No erythema.  Neurological: Grossly intact via light touch  Musculoskeletal Exam: No pedal deformities noted  Assessment/Plan of Care: 1.  Onychogryphosis of toenail with possible fungal nail infection bilateral great toes and right second digit  -Patient evaluated -Patient has now had this for several years.  I do believe that she would benefit from total permanent nail avulsion.  She has had the nails removed in the past and they have regrown dystrophic. -Prior to any permanent nail avulsion nail biopsy was taken today of the left great toe and sent to pathology for fungal culture.  If there is a fungal element I would recommend treating the fungus to see if this improves the nature of the nail.  If it is negative for fungus recommend total permanent nail avulsions -Return to clinic in 4 weeks to review fungal nail results and discuss       Thresa EMERSON Sar, DPM [facility]  Dr. Thresa EMERSON Sar, DPM    [address] [place], [place] [address]                Office [phone]  Fax [phone]

## 2023-11-29 ENCOUNTER — Ambulatory Visit: Admitting: Podiatry

## 2023-12-08 LAB — TIQ-NTM

## 2023-12-08 LAB — CULT, FUNGUS, SKIN,HAIR,NAIL W/KOH
MICRO NUMBER:: 16758987
SMEAR:: NONE SEEN
SPECIMEN QUALITY:: ADEQUATE

## 2023-12-08 LAB — HOUSE ACCOUNT TRACKING

## 2023-12-13 ENCOUNTER — Ambulatory Visit: Admitting: Podiatry

## 2023-12-27 ENCOUNTER — Ambulatory Visit: Admitting: Podiatry
